# Patient Record
Sex: Male | Born: 1985 | Race: Black or African American | Hispanic: No | Marital: Married | Smoking: Current every day smoker
Health system: Southern US, Community
[De-identification: ages and names within clinical notes are randomized; demographics above are authoritative.]

## PROBLEM LIST (undated history)

## (undated) HISTORY — PX: HERNIA REPAIR: SHX51

---

## 2004-12-06 ENCOUNTER — Emergency Department (HOSPITAL_COMMUNITY): Admission: EM | Admit: 2004-12-06 | Discharge: 2004-12-06 | Payer: Self-pay | Admitting: Emergency Medicine

## 2005-04-07 ENCOUNTER — Emergency Department (HOSPITAL_COMMUNITY): Admission: EM | Admit: 2005-04-07 | Discharge: 2005-04-07 | Payer: Self-pay | Admitting: Emergency Medicine

## 2005-04-16 ENCOUNTER — Emergency Department (HOSPITAL_COMMUNITY): Admission: EM | Admit: 2005-04-16 | Discharge: 2005-04-16 | Payer: Self-pay | Admitting: Emergency Medicine

## 2009-01-30 ENCOUNTER — Emergency Department (HOSPITAL_COMMUNITY): Admission: EM | Admit: 2009-01-30 | Discharge: 2009-01-30 | Payer: Self-pay | Admitting: Emergency Medicine

## 2010-11-14 ENCOUNTER — Ambulatory Visit (HOSPITAL_COMMUNITY)
Admission: RE | Admit: 2010-11-14 | Discharge: 2010-11-14 | Payer: Self-pay | Source: Home / Self Care | Admitting: Family Medicine

## 2010-12-23 ENCOUNTER — Encounter
Admission: RE | Admit: 2010-12-23 | Discharge: 2010-12-23 | Payer: Self-pay | Source: Home / Self Care | Attending: Family Medicine | Admitting: Family Medicine

## 2011-01-07 ENCOUNTER — Ambulatory Visit (HOSPITAL_COMMUNITY): Admission: RE | Admit: 2011-01-07 | Payer: Self-pay | Source: Home / Self Care | Admitting: Family Medicine

## 2011-01-11 ENCOUNTER — Encounter: Payer: Self-pay | Admitting: Family Medicine

## 2011-03-25 LAB — COMPREHENSIVE METABOLIC PANEL
ALT: 21 U/L (ref 0–53)
AST: 28 U/L (ref 0–37)
Albumin: 4.8 g/dL (ref 3.5–5.2)
Alkaline Phosphatase: 60 U/L (ref 39–117)
BUN: 9 mg/dL (ref 6–23)
CO2: 28 mEq/L (ref 19–32)
Calcium: 9.8 mg/dL (ref 8.4–10.5)
Chloride: 102 mEq/L (ref 96–112)
Creatinine, Ser: 0.84 mg/dL (ref 0.4–1.5)
GFR calc Af Amer: >60 mL/min (ref >60)
GFR calc non Af Amer: >60 mL/min (ref >60)
Glucose, Bld: 99 mg/dL (ref 70–99)
Potassium: 3.7 mEq/L (ref 3.5–5.1)
Sodium: 138 mEq/L (ref 135–145)
Total Bilirubin: 0.8 mg/dL (ref 0.3–1.2)
Total Protein: 7.6 g/dL (ref 6.0–8.3)

## 2011-03-25 LAB — CBC
HCT: 47.7 % (ref 39.0–52.0)
Hemoglobin: 16.3 g/dL (ref 13.0–17.0)
MCHC: 34.2 g/dL (ref 30.0–36.0)
MCV: 92.4 fL (ref 78.0–100.0)
Platelets: 225 10*3/uL (ref 150–400)
RBC: 5.16 MIL/uL (ref 4.22–5.81)
RDW: 13.7 % (ref 11.5–15.5)
WBC: 10.2 10*3/uL (ref 4.0–10.5)

## 2011-03-25 LAB — DIFFERENTIAL
Basophils Absolute: 0.1 10*3/uL (ref 0.0–0.1)
Basophils Relative: 1 % (ref 0–1)
Eosinophils Absolute: 0.4 10*3/uL (ref 0.0–0.7)
Eosinophils Relative: 4 % (ref 0–5)
Lymphocytes Relative: 36 % (ref 12–46)
Lymphs Abs: 3.6 10*3/uL (ref 0.7–4.0)
Monocytes Absolute: 0.6 10*3/uL (ref 0.1–1.0)
Monocytes Relative: 6 % (ref 3–12)
Neutro Abs: 5.5 10*3/uL (ref 1.7–7.7)
Neutrophils Relative %: 54 % (ref 43–77)

## 2012-09-04 IMAGING — CT CT ABD-PELV W/ CM
3 of 4 series · 12 of 36 positions shown, 18 images · IV contrast (READICAT/WATER & [ID] OMNI 300)
Comparison: None.

CLINICAL DATA: Abdominal pain.

CT ABDOMEN AND PELVIS WITH CONTRAST
TECHNIQUE: Multidetector CT imaging of the abdomen and pelvis was
performed following the standard protocol during bolus
administration of intravenous contrast.
Contrast: 100 ml Fmnipaque-NBB

[Series 3: routine abdomen · axial · 0.70mm/px · z∈[-357,-47]mm · 8 of 80 slices shown, 13 images]
[im 9/80  soft-tissue]
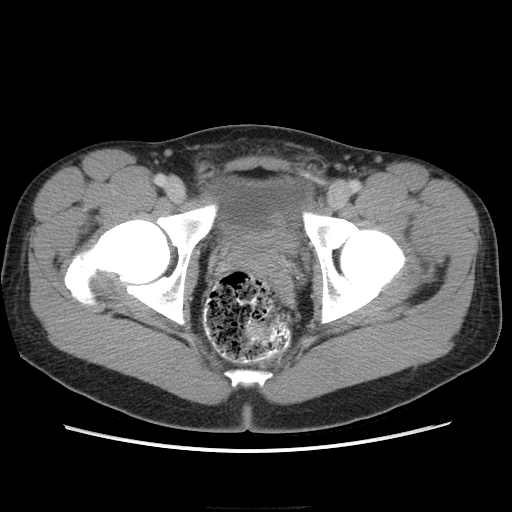
[im 9/80  bone]
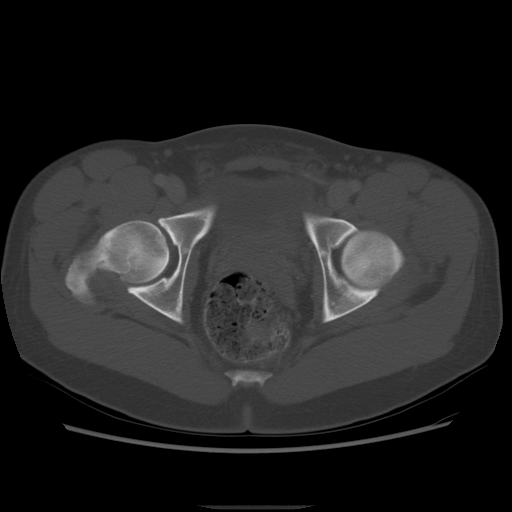
[im 18/80  soft-tissue]
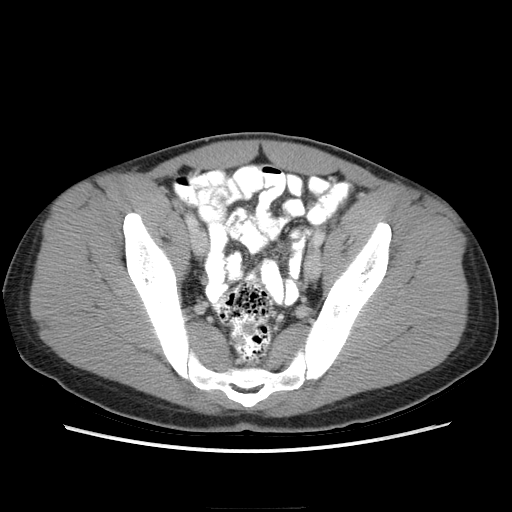
[im 27/80  soft-tissue]
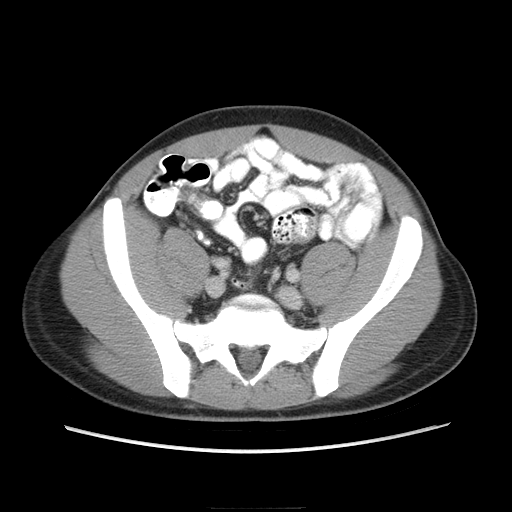
[im 36/80  soft-tissue]
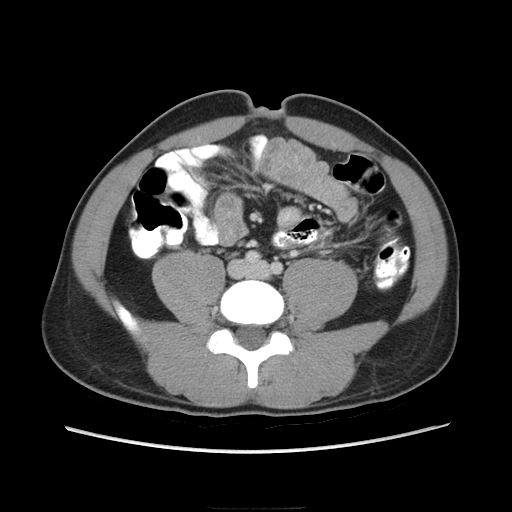
[im 44/80  soft-tissue]
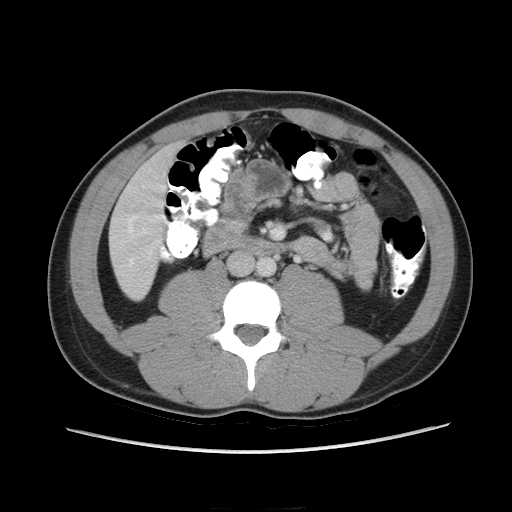
[im 44/80  lung]
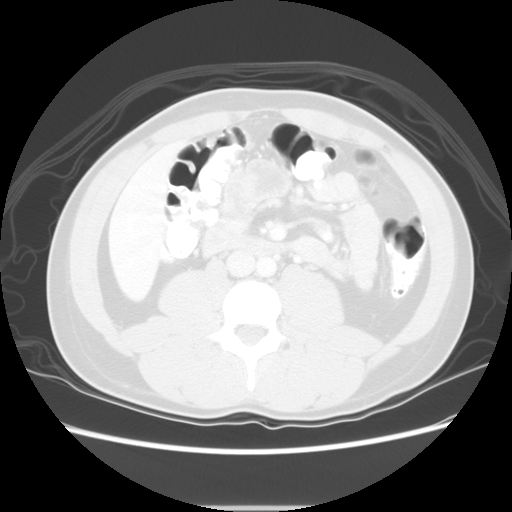
[im 53/80  soft-tissue]
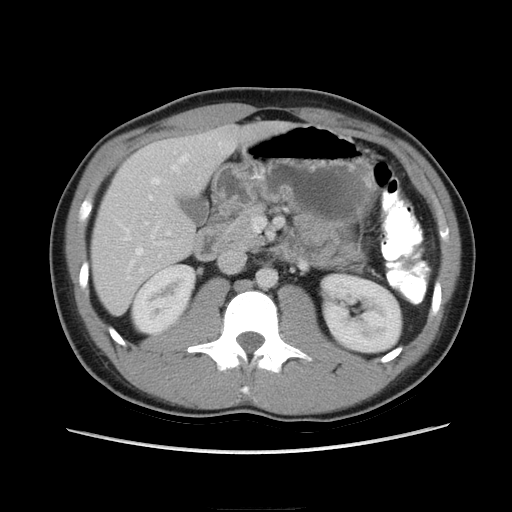
[im 53/80  lung]
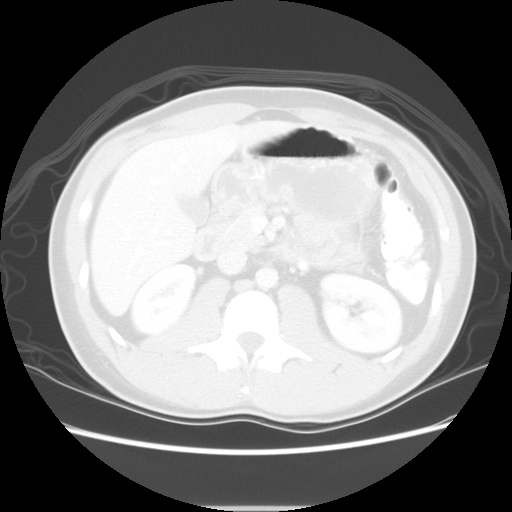
[im 62/80  soft-tissue]
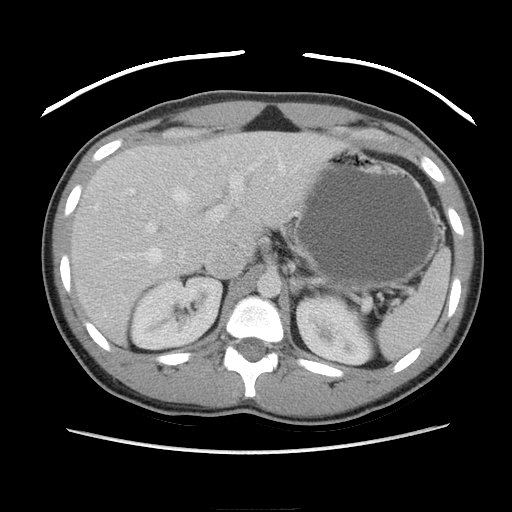
[im 62/80  lung]
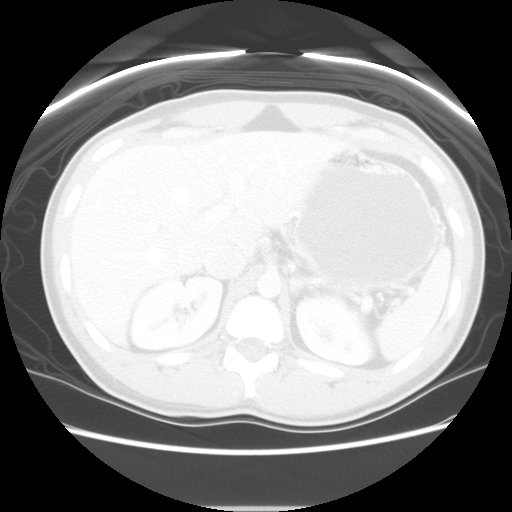
[im 71/80  soft-tissue]
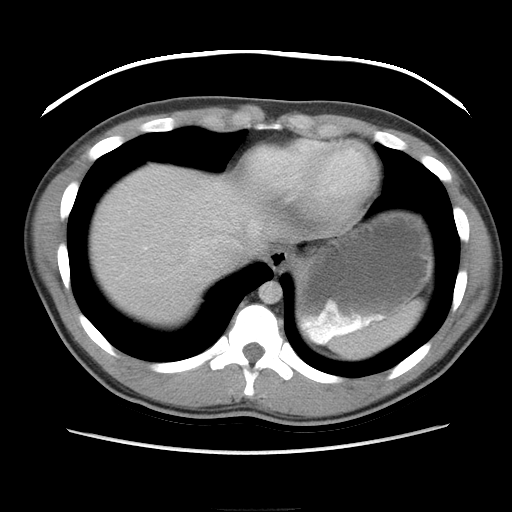
[im 71/80  lung]
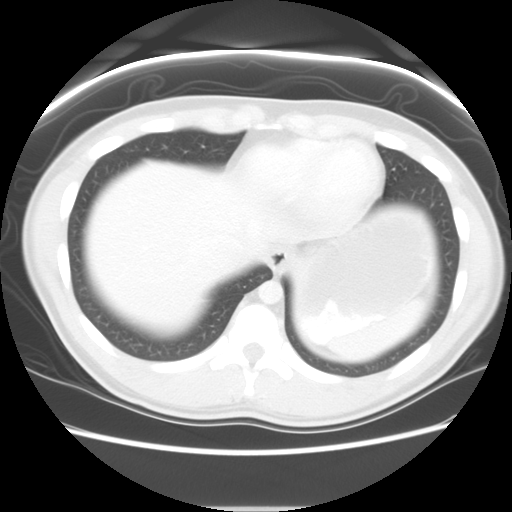

[Series 601: coronal body · coronal · 0.87mm/px · 1 of 116 slices shown, 2 images]
[im 39/116  soft-tissue]
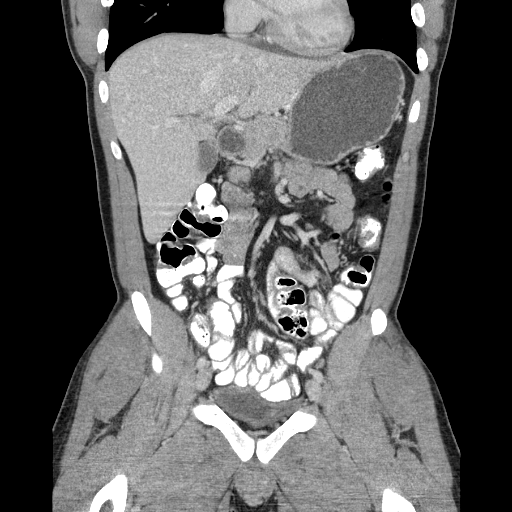
[im 39/116  bone]
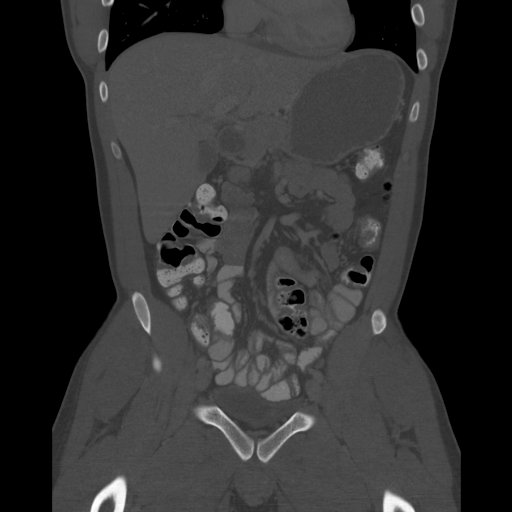

[Series 602: sagittal body · sagittal · 0.87mm/px · 3 of 145 slices shown]
[im 17/145  soft-tissue]
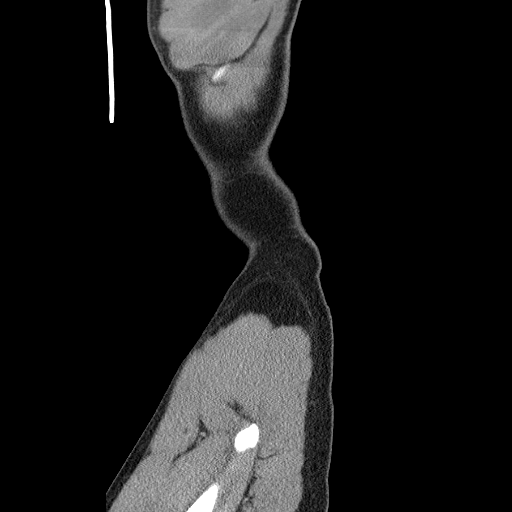
[im 33/145  soft-tissue]
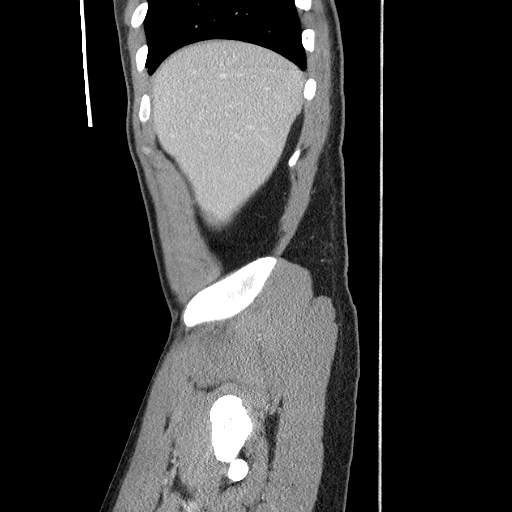
[im 49/145  soft-tissue]
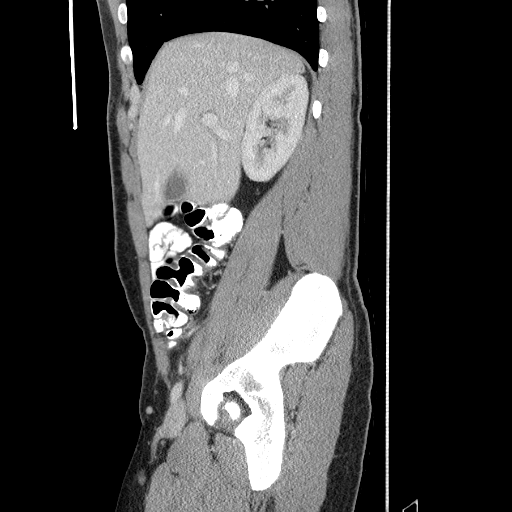

[12 of 36 positions shown; findings below may reference images not displayed]

FINDINGS: The liver, spleen, pancreas, and adrenal glands appear
unremarkable.

The gallbladder and biliary system appear unremarkable.

There is a 9 mm hypodense lesion in the left kidney upper pole
statistically highly likely to represent a cyst, although
technically nonspecific.

The right kidney appears unremarkable.

No pathologic retroperitoneal or porta hepatis adenopathy is
identified.

No pathologic pelvic adenopathy is identified.

The appendix appears normal.

Orally administered contrast extends through to the rectum.  No
dilated bowel noted.

Left inguinal hernia repair mesh noted with mild surrounding
scarring.

Posterior density noted in the urinary bladder, for example on
image 79 of series 602.  I cannot exclude a mucosal lesion of the
urinary bladder, although this could represent an infolding of the
collapsed bladder wall or conceivably early contrast excretion by
the kidneys simulating a lesion posteriorly in the urinary bladder.
IMPRESSION: 1.  Prior hernia repair in the left inguinal region, without local
complicating feature identified.  No recurrent hernia noted.
2.  Possible filling defect posteriorly in the urinary bladder.
This could possibly represent an infolding of the bladder wall or a
jet of contrast from the ureter, but I cannot rule out a posterior
mucosal mass of the urinary bladder.  Accordingly, I do recommend
pelvic sonography with a full bladder in order to characterize the
posterior urinary bladder and ensure that there is no mass in this
vicinity.  If the patient has hematuria, then cystoscopy may be
warranted.
3.  Small hypodense lesion of the left kidney is likely a small
cyst.

## 2015-11-09 ENCOUNTER — Encounter: Payer: Self-pay | Admitting: Family Medicine

## 2015-11-14 ENCOUNTER — Encounter: Payer: Self-pay | Admitting: Family Medicine

## 2015-11-14 ENCOUNTER — Ambulatory Visit (INDEPENDENT_AMBULATORY_CARE_PROVIDER_SITE_OTHER): Payer: BLUE CROSS/BLUE SHIELD | Admitting: Family Medicine

## 2015-11-14 VITALS — BP 115/66 | HR 89 | Temp 97.8°F | Ht 70.0 in | Wt 174.8 lb

## 2015-11-14 DIAGNOSIS — Z0000 Encounter for general adult medical examination without abnormal findings: Secondary | ICD-10-CM

## 2015-11-14 NOTE — Patient Instructions (Signed)
Smoking Cessation, Tips for Success If you are ready to quit smoking, congratulations! You have chosen to help yourself be healthier. Cigarettes bring nicotine, tar, carbon monoxide, and other irritants into your body. Your lungs, heart, and blood vessels will be able to work better without these poisons. There are many different ways to quit smoking. Nicotine gum, nicotine patches, a nicotine inhaler, or nicotine nasal spray can help with physical craving. Hypnosis, support groups, and medicines help break the habit of smoking. WHAT THINGS CAN I DO TO MAKE QUITTING EASIER?  Here are some tips to help you quit for good:  Pick a date when you will quit smoking completely. Tell all of your friends and family about your plan to quit on that date.  Do not try to slowly cut down on the number of cigarettes you are smoking. Pick a quit date and quit smoking completely starting on that day.  Throw away all cigarettes.   Clean and remove all ashtrays from your home, work, and car.  On a card, write down your reasons for quitting. Carry the card with you and read it when you get the urge to smoke.  Cleanse your body of nicotine. Drink enough water and fluids to keep your urine clear or pale yellow. Do this after quitting to flush the nicotine from your body.  Learn to predict your moods. Do not let a bad situation be your excuse to have a cigarette. Some situations in your life might tempt you into wanting a cigarette.  Never have "just one" cigarette. It leads to wanting another and another. Remind yourself of your decision to quit.  Change habits associated with smoking. If you smoked while driving or when feeling stressed, try other activities to replace smoking. Stand up when drinking your coffee. Brush your teeth after eating. Sit in a different chair when you read the paper. Avoid alcohol while trying to quit, and try to drink fewer caffeinated beverages. Alcohol and caffeine may urge you to  smoke.  Avoid foods and drinks that can trigger a desire to smoke, such as sugary or spicy foods and alcohol.  Ask people who smoke not to smoke around you.  Have something planned to do right after eating or having a cup of coffee. For example, plan to take a walk or exercise.  Try a relaxation exercise to calm you down and decrease your stress. Remember, you may be tense and nervous for the first 2 weeks after you quit, but this will pass.  Find new activities to keep your hands busy. Play with a pen, coin, or rubber band. Doodle or draw things on paper.  Brush your teeth right after eating. This will help cut down on the craving for the taste of tobacco after meals. You can also try mouthwash.   Use oral substitutes in place of cigarettes. Try using lemon drops, carrots, cinnamon sticks, or chewing gum. Keep them handy so they are available when you have the urge to smoke.  When you have the urge to smoke, try deep breathing.  Designate your home as a nonsmoking area.  If you are a heavy smoker, ask your health care provider about a prescription for nicotine chewing gum. It can ease your withdrawal from nicotine.  Reward yourself. Set aside the cigarette money you save and buy yourself something nice.  Look for support from others. Join a support group or smoking cessation program. Ask someone at home or at work to help you with your plan   to quit smoking.  Always ask yourself, "Do I need this cigarette or is this just a reflex?" Tell yourself, "Today, I choose not to smoke," or "I do not want to smoke." You are reminding yourself of your decision to quit.  Do not replace cigarette smoking with electronic cigarettes (commonly called e-cigarettes). The safety of e-cigarettes is unknown, and some may contain harmful chemicals.  If you relapse, do not give up! Plan ahead and think about what you will do the next time you get the urge to smoke. HOW WILL I FEEL WHEN I QUIT SMOKING? You  may have symptoms of withdrawal because your body is used to nicotine (the addictive substance in cigarettes). You may crave cigarettes, be irritable, feel very hungry, cough often, get headaches, or have difficulty concentrating. The withdrawal symptoms are only temporary. They are strongest when you first quit but will go away within 10-14 days. When withdrawal symptoms occur, stay in control. Think about your reasons for quitting. Remind yourself that these are signs that your body is healing and getting used to being without cigarettes. Remember that withdrawal symptoms are easier to treat than the major diseases that smoking can cause.  Even after the withdrawal is over, expect periodic urges to smoke. However, these cravings are generally short lived and will go away whether you smoke or not. Do not smoke! WHAT RESOURCES ARE AVAILABLE TO HELP ME QUIT SMOKING? Your health care provider can direct you to community resources or hospitals for support, which may include:  Group support.  Education.  Hypnosis.  Therapy.   This information is not intended to replace advice given to you by your health care provider. Make sure you discuss any questions you have with your health care provider.   Document Released: 08/22/2004 Document Revised: 12/15/2014 Document Reviewed: 05/12/2013 Elsevier Interactive Patient Education 2016 [employer]. Testicular Self-Exam A self-examination of your testicles involves looking at and feeling your testicles for abnormal lumps or swelling. Several things can cause swelling, lumps, or pain in your testicles. Some of these causes are:  Injuries.  Inflammation.  Infection.  Accumulation of fluids around your testicle (hydrocele).  Twisted testicles (testicular torsion).  Testicular cancer. Self-examination of the testicles and groin areas may be advised if you are at risk for testicular cancer. Risks for testicular cancer include:  An undescended testicle  (cryptorchidism).  A history of previous testicular cancer.  A family history of testicular cancer. The testicles are easiest to examine after warm baths or showers and are more difficult to examine when you are cold. This is because the muscles attached to the testicles retract and pull them up higher or into the abdomen. Follow these steps while you are standing:  Hold your penis away from your body.  Roll one testicle between your thumb and forefinger, feeling the entire testicle.  Roll the other testicle between your thumb and forefinger, feeling the entire testicle. Feel for lumps, swelling, or discomfort. A normal testicle is egg shaped and feels firm. It is smooth and not tender. The spermatic cord can be felt as a firm spaghetti-like cord at the back of your testicle. It is also important to examine the crease between the front of your leg and your abdomen. Feel for any bumps that are tender. These could be enlarged lymph nodes.    This information is not intended to replace advice given to you by your health care provider. Make sure you discuss any questions you have with your health care provider.  Document Released: 03/02/2001 Document Revised: 07/27/2013 Document Reviewed: 05/16/2013 Elsevier Interactive Patient Education [employer] Inc.

## 2015-11-14 NOTE — Progress Notes (Signed)
Subjective:  Patient ID: Justin Flynn, male    DOB: [birth year]  Age: 29 y.o. MRN: [id number]  CC: Annual Exam   HPI Justin Flynn presents for CPE  History Justin Flynn has no past medical history on file.   He has past surgical history that includes Hernia repair.   His family history includes Cancer in his mother.He reports that he has been smoking Cigarettes.  He started smoking about 12 years ago. He has been smoking about 0.50 packs per day. He does not have any smokeless tobacco history on file. He reports that he drinks alcohol. He reports that he does not use illicit drugs.  No current outpatient prescriptions on file prior to visit.   No current facility-administered medications on file prior to visit.    ROS Review of Systems  Constitutional: Negative for fever, chills, diaphoresis, activity change, appetite change, fatigue and unexpected weight change.  HENT: Negative for congestion, ear pain, hearing loss, postnasal drip, rhinorrhea, sore throat, tinnitus and trouble swallowing.   Eyes: Negative for photophobia, pain, discharge, redness and visual disturbance.  Respiratory: Negative for apnea, cough, choking, chest tightness, shortness of breath, wheezing and stridor.   Cardiovascular: Negative for chest pain, palpitations and leg swelling.  Gastrointestinal: Negative for nausea, vomiting, abdominal pain, diarrhea, constipation, blood in stool and abdominal distention.  Endocrine: Negative for cold intolerance, heat intolerance, polydipsia, polyphagia and polyuria.  Genitourinary: Negative for dysuria, urgency, frequency, hematuria, flank pain, enuresis, difficulty urinating and genital sores.  Musculoskeletal: Negative for joint swelling and arthralgias.  Skin: Negative for color change, rash and wound.  Allergic/Immunologic: Negative for immunocompromised state.  Neurological: Negative for dizziness, tremors, seizures, syncope, facial asymmetry, speech difficulty,  weakness, light-headedness, numbness and headaches.  Hematological: Does not bruise/bleed easily.  Psychiatric/Behavioral: Negative for suicidal ideas, hallucinations, behavioral problems, confusion, sleep disturbance, dysphoric mood, decreased concentration and agitation. The patient is not nervous/anxious and is not hyperactive.     Objective:  BP 115/66 mmHg  Pulse 89  Temp(Src) 97.8 F (36.6 C) (Oral)  Ht 5' 10"  (1.778 m)  Wt 174 lb 12.8 oz (79.289 kg)  BMI 25.08 kg/m2  SpO2 99%  Physical Exam  Constitutional: He is oriented to person, place, and time. He appears well-developed and well-nourished. No distress.  HENT:  Head: Normocephalic and atraumatic.  Right Ear: External ear normal.  Left Ear: External ear normal.  Nose: Nose normal.  Mouth/Throat: Oropharynx is clear and moist.  Eyes: Conjunctivae and EOM are normal. Pupils are equal, round, and reactive to light.  Neck: Normal range of motion. Neck supple. No tracheal deviation present. No thyromegaly present.  Cardiovascular: Normal rate, regular rhythm and normal heart sounds.  Exam reveals no gallop and no friction rub.   No murmur heard. Pulmonary/Chest: Effort normal and breath sounds normal. No respiratory distress. He has no wheezes. He has no rales.  Abdominal: Soft. Bowel sounds are normal. He exhibits no distension and no mass. There is no tenderness.  Musculoskeletal: Normal range of motion. He exhibits no edema.  Lymphadenopathy:    He has no cervical adenopathy.  Neurological: He is alert and oriented to person, place, and time. He has normal reflexes.  Skin: Skin is warm and dry.  Psychiatric: He has a normal mood and affect. His behavior is normal. Judgment and thought content normal.    Assessment & Plan:   Justin Flynn was seen today for annual exam.  Diagnoses and all orders for this visit:  Wellness examination -  CBC with Differential/Platelet -     CMP14+EGFR -     Lipid panel -     TSH -      VITAMIN D 25 Hydroxy (Vit-D Deficiency, Fractures) -     Hepatitis c antibody (reflex) -     POCT urinalysis dipstick   Mr. Schetter does not currently have medications on file.  No orders of the defined types were placed in this encounter.   Patient was counseled on appropriate diet. Low carbohydrate, low sodium, high protein approach.  Regular exercise benefit with mix of cardio and resistance training was reviewed.  Reminded to wear seat belt when driving or a passenger.  Testicular self exam taught. Handout given. Smoking cessation reviewed. Handout given. Chantix offered  Follow-up: Return in about 2 years (around 11/13/2017) for CPE.  Claretta Fraise, M.D.

## 2017-02-19 ENCOUNTER — Encounter: Payer: Self-pay | Admitting: Family

## 2017-02-19 ENCOUNTER — Ambulatory Visit (INDEPENDENT_AMBULATORY_CARE_PROVIDER_SITE_OTHER): Payer: BLUE CROSS/BLUE SHIELD | Admitting: Family

## 2017-02-19 VITALS — BP 111/61 | HR 73 | Temp 97.5°F | Ht 70.0 in | Wt 178.0 lb

## 2017-02-19 DIAGNOSIS — R109 Unspecified abdominal pain: Secondary | ICD-10-CM | POA: Diagnosis not present

## 2017-02-19 DIAGNOSIS — N3000 Acute cystitis without hematuria: Secondary | ICD-10-CM | POA: Diagnosis not present

## 2017-02-19 LAB — URINALYSIS
Bilirubin, UA: NEGATIVE
Glucose, UA: NEGATIVE
Ketones, UA: NEGATIVE
Nitrite, UA: POSITIVE — AB
Protein, UA: NEGATIVE
Specific Gravity, UA: 1.015 (ref 1.005–1.030)
Urobilinogen, Ur: 0.2 mg/dL (ref 0.2–1.0)
pH, UA: 7 (ref 5.0–7.5)

## 2017-02-19 MED ORDER — CIPROFLOXACIN HCL 500 MG PO TABS: 500.0000 mg | ORAL_TABLET | Freq: Two times a day (BID) | ORAL | 0 refills | Status: AC

## 2017-02-19 NOTE — Progress Notes (Signed)
   Subjective:    Patient ID: Justin Flynn, male    DOB: 07/13/1986, 31 y.o.   MRN: [id number]  Flank Pain  This is a new problem. The current episode started in the past 7 days. The problem occurs intermittently. The problem has been waxing and waning since onset. The quality of the pain is described as burning and aching. The pain is at a severity of 3/10. The pain is mild. Associated symptoms include dysuria. Pertinent negatives include no bladder incontinence, bowel incontinence, tingling or weakness. He has tried nothing for the symptoms. The treatment provided no relief.      Review of Systems  Gastrointestinal: Negative for bowel incontinence.  Genitourinary: Positive for dysuria and flank pain. Negative for bladder incontinence.  Neurological: Negative for tingling and weakness.  All other systems reviewed and are negative.      Objective:   Physical Exam  Constitutional: He is oriented to person, place, and time. He appears well-developed and well-nourished. No distress.  HENT:  Head: Normocephalic.  Eyes: Pupils are equal, round, and reactive to light. Right eye exhibits no discharge. Left eye exhibits no discharge.  Neck: Normal range of motion. Neck supple. No thyromegaly present.  Cardiovascular: Normal rate, regular rhythm, normal heart sounds and intact distal pulses.   No murmur heard. Pulmonary/Chest: Effort normal and breath sounds normal. No respiratory distress. He has no wheezes.  Abdominal: Soft. Bowel sounds are normal. He exhibits no distension. There is no tenderness.  Musculoskeletal: Normal range of motion. He exhibits no edema or tenderness.  Negative CVA tenderness   Neurological: He is alert and oriented to person, place, and time.  Skin: Skin is warm and dry. No rash noted. No erythema.  Psychiatric: He has a normal mood and affect. His behavior is normal. Judgment and thought content normal.  Vitals reviewed.   BP 111/61 (BP Location: Left Arm,  Patient Position: Sitting, Cuff Size: Normal)   Pulse 73   Temp 97.5 F (36.4 C) (Oral)   Ht 5\' 10"  (1.778 m)   Wt 178 lb (80.7 kg)   BMI 25.54 kg/m       Assessment & Plan:  1. Right flank pain - Urine culture - Urinalysis  2. Acute cystitis without hematuria Force fluids AZO over the counter X2 days RTO prn Culture pending - ciprofloxacin (CIPRO) 500 MG tablet; Take 1 tablet (500 mg total) by mouth 2 (two) times daily.  Dispense: 14 tablet; Refill: 0   Jannifer Rodneyhristy Hawks, FNP

## 2017-02-19 NOTE — Patient Instructions (Signed)
Urinary Tract Infection, Adult A urinary tract infection (UTI) is an infection of any part of the urinary tract, which includes the kidneys, ureters, bladder, and urethra. These organs make, store, and get rid of urine in the body. UTI can be a bladder infection (cystitis) or kidney infection (pyelonephritis). What are the causes? This infection may be caused by fungi, viruses, or bacteria. Bacteria are the most common cause of UTIs. This condition can also be caused by repeated incomplete emptying of the bladder during urination. What increases the risk? This condition is more likely to develop if:  You ignore your need to urinate or hold urine for long periods of time.  You do not empty your bladder completely during urination.  You wipe back to front after urinating or having a bowel movement, if you are male.  You are uncircumcised, if you are male.  You are constipated.  You have a urinary catheter that stays in place (indwelling).  You have a weak defense (immune) system.  You have a medical condition that affects your bowels, kidneys, or bladder.  You have diabetes.  You take antibiotic medicines frequently or for long periods of time, and the antibiotics no longer work well against certain types of infections (antibiotic resistance).  You take medicines that irritate your urinary tract.  You are exposed to chemicals that irritate your urinary tract.  You are male. What are the signs or symptoms? Symptoms of this condition include:  Fever.  Frequent urination or passing small amounts of urine frequently.  Needing to urinate urgently.  Pain or burning with urination.  Urine that smells bad or unusual.  Cloudy urine.  Pain in the lower abdomen or back.  Trouble urinating.  Blood in the urine.  Vomiting or being less hungry than normal.  Diarrhea or abdominal pain.  Vaginal discharge, if you are male. How is this diagnosed? This condition is  diagnosed with a medical history and physical exam. You will also need to provide a urine sample to test your urine. Other tests may be done, including:  Blood tests.  Sexually transmitted disease (STD) testing. If you have had more than one UTI, a cystoscopy or imaging studies may be done to determine the cause of the infections. How is this treated? Treatment for this condition often includes a combination of two or more of the following:  Antibiotic medicine.  Other medicines to treat less common causes of UTI.  Over-the-counter medicines to treat pain.  Drinking enough water to stay hydrated. Follow these instructions at home:  Take over-the-counter and prescription medicines only as told by your health care provider.  If you were prescribed an antibiotic, take it as told by your health care provider. Do not stop taking the antibiotic even if you start to feel better.  Avoid alcohol, caffeine, tea, and carbonated beverages. They can irritate your bladder.  Drink enough fluid to keep your urine clear or pale yellow.  Keep all follow-up visits as told by your health care provider. This is important.  Make sure to:  Empty your bladder often and completely. Do not hold urine for long periods of time.  Empty your bladder before and after sex.  Wipe from front to back after a bowel movement if you are male. Use each tissue one time when you wipe. Contact a health care provider if:  You have back pain.  You have a fever.  You feel nauseous or vomit.  Your symptoms do not get better after 3   days.  Your symptoms go away and then return. Get help right away if:  You have severe back pain or lower abdominal pain.  You are vomiting and cannot keep down any medicines or water. This information is not intended to replace advice given to you by your health care provider. Make sure you discuss any questions you have with your health care provider. Document Released:  09/03/2005 Document Revised: 05/07/2016 Document Reviewed: 10/15/2015 Elsevier Interactive Patient Education  2017 [employer] Inc.  

## 2017-02-23 LAB — URINE CULTURE

## 2017-11-16 ENCOUNTER — Encounter: Payer: BLUE CROSS/BLUE SHIELD | Admitting: Family Medicine

## 2017-11-24 ENCOUNTER — Encounter: Payer: Self-pay | Admitting: Family Medicine

## 2017-12-02 ENCOUNTER — Encounter: Payer: Self-pay | Admitting: Family Medicine

## 2018-02-01 ENCOUNTER — Ambulatory Visit: Payer: Self-pay | Admitting: Family Medicine
